# Patient Record
Sex: Male | Born: 1935 | Race: White | Hispanic: No
Health system: Southern US, Community
[De-identification: ages and names within clinical notes are randomized; demographics above are authoritative.]

---

## 2006-08-20 ENCOUNTER — Ambulatory Visit: Payer: Self-pay | Admitting: Internal Medicine

## 2008-02-20 ENCOUNTER — Ambulatory Visit: Payer: Self-pay | Admitting: Internal Medicine

## 2008-10-09 ENCOUNTER — Ambulatory Visit: Payer: Self-pay | Admitting: Internal Medicine

## 2010-10-01 ENCOUNTER — Ambulatory Visit: Payer: Self-pay | Admitting: Ophthalmology

## 2010-10-14 ENCOUNTER — Ambulatory Visit: Payer: Self-pay | Admitting: Ophthalmology

## 2013-12-24 ENCOUNTER — Ambulatory Visit: Payer: Self-pay | Admitting: Internal Medicine

## 2014-01-01 LAB — CBC
HCT: 41.2 % (ref 40.0–52.0)
HGB: 13.8 g/dL (ref 13.0–18.0)
MCH: 34.8 pg — ABNORMAL HIGH (ref 26.0–34.0)
MCHC: 33.4 g/dL (ref 32.0–36.0)
MCV: 104 fL — AB (ref 80–100)
PLATELETS: 74 10*3/uL — AB (ref 150–440)
RBC: 3.95 10*6/uL — ABNORMAL LOW (ref 4.40–5.90)
RDW: 17.1 % — ABNORMAL HIGH (ref 11.5–14.5)
WBC: 5.8 10*3/uL (ref 3.8–10.6)

## 2014-01-01 LAB — COMPREHENSIVE METABOLIC PANEL
Albumin: 3.7 g/dL (ref 3.4–5.0)
Alkaline Phosphatase: 220 U/L — ABNORMAL HIGH
Anion Gap: 7 (ref 7–16)
BUN: 27 mg/dL — ABNORMAL HIGH (ref 7–18)
Bilirubin,Total: 2.8 mg/dL — ABNORMAL HIGH (ref 0.2–1.0)
CHLORIDE: 105 mmol/L (ref 98–107)
Calcium, Total: 8.8 mg/dL (ref 8.5–10.1)
Co2: 28 mmol/L (ref 21–32)
Creatinine: 1.3 mg/dL (ref 0.60–1.30)
GFR CALC AF AMER: 59 — AB
GFR CALC NON AF AMER: 51 — AB
Glucose: 95 mg/dL (ref 65–99)
Osmolality: 284 (ref 275–301)
Potassium: 3.7 mmol/L (ref 3.5–5.1)
SGOT(AST): 589 U/L — ABNORMAL HIGH (ref 15–37)
SGPT (ALT): 433 U/L — ABNORMAL HIGH (ref 12–78)
Sodium: 140 mmol/L (ref 136–145)
Total Protein: 6.5 g/dL (ref 6.4–8.2)

## 2014-01-01 LAB — PROTIME-INR
INR: 2.3
PROTHROMBIN TIME: 24.6 s — AB (ref 11.5–14.7)

## 2014-01-01 LAB — PRO B NATRIURETIC PEPTIDE: B-Type Natriuretic Peptide: 16079 pg/mL — ABNORMAL HIGH (ref 0–450)

## 2014-01-01 LAB — LIPASE, BLOOD: LIPASE: 76 U/L (ref 73–393)

## 2014-01-01 LAB — APTT: Activated PTT: 38.4 secs — ABNORMAL HIGH (ref 23.6–35.9)

## 2014-01-01 LAB — TROPONIN I: Troponin-I: 0.05 ng/mL

## 2014-01-01 LAB — MAGNESIUM: MAGNESIUM: 2.2 mg/dL

## 2014-01-02 ENCOUNTER — Inpatient Hospital Stay: Payer: Self-pay | Admitting: Internal Medicine

## 2014-01-02 LAB — URINALYSIS, COMPLETE
BILIRUBIN, UR: NEGATIVE
BLOOD: NEGATIVE
Bacteria: NONE SEEN
GLUCOSE, UR: NEGATIVE mg/dL (ref 0–75)
Hyaline Cast: 3
Ketone: NEGATIVE
Leukocyte Esterase: NEGATIVE
NITRITE: NEGATIVE
Ph: 5 (ref 4.5–8.0)
Protein: 30
Specific Gravity: 1.02 (ref 1.003–1.030)
Squamous Epithelial: <1
WBC UR: 5 /HPF (ref 0–5)

## 2014-01-02 LAB — CK TOTAL AND CKMB (NOT AT ARMC)
CK, TOTAL: 331 U/L — AB
CK, Total: 391 U/L — ABNORMAL HIGH
CK, Total: 394 U/L — ABNORMAL HIGH
CK-MB: 4 ng/mL — ABNORMAL HIGH (ref 0.5–3.6)
CK-MB: 3.7 ng/mL — ABNORMAL HIGH (ref 0.5–3.6)
CK-MB: 4.3 ng/mL — AB (ref 0.5–3.6)

## 2014-01-02 LAB — TROPONIN I
TROPONIN-I: 0.04 ng/mL
Troponin-I: 0.04 ng/mL

## 2014-01-03 LAB — COMPREHENSIVE METABOLIC PANEL
ANION GAP: 4 — AB (ref 7–16)
AST: 228 U/L — AB (ref 15–37)
Albumin: 3.1 g/dL — ABNORMAL LOW (ref 3.4–5.0)
Alkaline Phosphatase: 193 U/L — ABNORMAL HIGH
BUN: 20 mg/dL — ABNORMAL HIGH (ref 7–18)
Bilirubin,Total: 1.5 mg/dL — ABNORMAL HIGH (ref 0.2–1.0)
CO2: 29 mmol/L (ref 21–32)
Calcium, Total: 8 mg/dL — ABNORMAL LOW (ref 8.5–10.1)
Chloride: 103 mmol/L (ref 98–107)
Creatinine: 1.17 mg/dL (ref 0.60–1.30)
EGFR (African American): >60
EGFR (Non-African Amer.): 58 — ABNORMAL LOW
Glucose: 83 mg/dL (ref 65–99)
Osmolality: 274 (ref 275–301)
Potassium: 3.2 mmol/L — ABNORMAL LOW (ref 3.5–5.1)
SGPT (ALT): 260 U/L — ABNORMAL HIGH (ref 12–78)
Sodium: 136 mmol/L (ref 136–145)
TOTAL PROTEIN: 5.7 g/dL — AB (ref 6.4–8.2)

## 2014-01-03 LAB — CBC WITH DIFFERENTIAL/PLATELET
BASOS PCT: 0.9 %
Basophil #: 0 10*3/uL (ref 0.0–0.1)
Eosinophil #: 0.2 10*3/uL (ref 0.0–0.7)
Eosinophil %: 3.9 %
HCT: 36.1 % — ABNORMAL LOW (ref 40.0–52.0)
HGB: 12.4 g/dL — ABNORMAL LOW (ref 13.0–18.0)
LYMPHS ABS: 0.6 10*3/uL — AB (ref 1.0–3.6)
LYMPHS PCT: 12.5 %
MCH: 35.7 pg — ABNORMAL HIGH (ref 26.0–34.0)
MCHC: 34.3 g/dL (ref 32.0–36.0)
MCV: 104 fL — AB (ref 80–100)
MONOS PCT: 6.6 %
Monocyte #: 0.3 x10 3/mm (ref 0.2–1.0)
NEUTROS ABS: 3.6 10*3/uL (ref 1.4–6.5)
Neutrophil %: 76.1 %
Platelet: 70 10*3/uL — ABNORMAL LOW (ref 150–440)
RBC: 3.47 10*6/uL — ABNORMAL LOW (ref 4.40–5.90)
RDW: 16.9 % — AB (ref 11.5–14.5)
WBC: 4.7 10*3/uL (ref 3.8–10.6)

## 2014-01-03 LAB — LIPID PANEL
Cholesterol: 196 mg/dL (ref 0–200)
HDL: 35 mg/dL — AB (ref 40–60)
Ldl Cholesterol, Calc: 143 mg/dL — ABNORMAL HIGH (ref 0–100)
Triglycerides: 90 mg/dL (ref 0–200)
VLDL CHOLESTEROL, CALC: 18 mg/dL (ref 5–40)

## 2014-01-03 LAB — PROTIME-INR
INR: 1.7
Prothrombin Time: 19.4 secs — ABNORMAL HIGH (ref 11.5–14.7)

## 2014-01-03 LAB — MAGNESIUM: Magnesium: 2 mg/dL

## 2014-01-05 LAB — PROTIME-INR
INR: 1.5
PROTHROMBIN TIME: 17.5 s — AB (ref 11.5–14.7)

## 2014-01-06 LAB — PROTIME-INR
INR: 1.4
PROTHROMBIN TIME: 17.2 s — AB (ref 11.5–14.7)

## 2014-01-07 LAB — BASIC METABOLIC PANEL
Anion Gap: 1 — ABNORMAL LOW (ref 7–16)
BUN: 12 mg/dL (ref 7–18)
CALCIUM: 8.2 mg/dL — AB (ref 8.5–10.1)
CO2: 35 mmol/L — AB (ref 21–32)
CREATININE: 0.85 mg/dL (ref 0.60–1.30)
Chloride: 98 mmol/L (ref 98–107)
EGFR (African American): >60
EGFR (Non-African Amer.): >60
Glucose: 100 mg/dL — ABNORMAL HIGH (ref 65–99)
Osmolality: 268 (ref 275–301)
Potassium: 4.5 mmol/L (ref 3.5–5.1)
Sodium: 134 mmol/L — ABNORMAL LOW (ref 136–145)

## 2014-01-07 LAB — CULTURE, BLOOD (SINGLE)

## 2014-01-07 LAB — PROTIME-INR
INR: 1.5
Prothrombin Time: 17.6 secs — ABNORMAL HIGH (ref 11.5–14.7)

## 2014-01-08 LAB — BASIC METABOLIC PANEL
Anion Gap: 1 — ABNORMAL LOW (ref 7–16)
BUN: 12 mg/dL (ref 7–18)
CO2: 33 mmol/L — AB (ref 21–32)
CREATININE: 0.82 mg/dL (ref 0.60–1.30)
Calcium, Total: 8.2 mg/dL — ABNORMAL LOW (ref 8.5–10.1)
Chloride: 96 mmol/L — ABNORMAL LOW (ref 98–107)
Glucose: 83 mg/dL (ref 65–99)
Osmolality: 260 (ref 275–301)
POTASSIUM: 4.6 mmol/L (ref 3.5–5.1)
Sodium: 130 mmol/L — ABNORMAL LOW (ref 136–145)

## 2014-01-08 LAB — PROTIME-INR
INR: 1.6
Prothrombin Time: 18.4 secs — ABNORMAL HIGH (ref 11.5–14.7)

## 2014-01-09 LAB — CBC WITH DIFFERENTIAL/PLATELET
BASOS PCT: 0.3 %
Basophil #: 0 10*3/uL (ref 0.0–0.1)
Eosinophil #: 0.1 10*3/uL (ref 0.0–0.7)
Eosinophil %: 2.7 %
HCT: 37.3 % — AB (ref 40.0–52.0)
HGB: 12.4 g/dL — ABNORMAL LOW (ref 13.0–18.0)
LYMPHS PCT: 5.2 %
Lymphocyte #: 0.2 10*3/uL — ABNORMAL LOW (ref 1.0–3.6)
MCH: 34.4 pg — AB (ref 26.0–34.0)
MCHC: 33.1 g/dL (ref 32.0–36.0)
MCV: 104 fL — AB (ref 80–100)
Monocyte #: 0.4 x10 3/mm (ref 0.2–1.0)
Monocyte %: 11.1 %
NEUTROS ABS: 3.2 10*3/uL (ref 1.4–6.5)
Neutrophil %: 80.7 %
Platelet: 72 10*3/uL — ABNORMAL LOW (ref 150–440)
RBC: 3.6 10*6/uL — AB (ref 4.40–5.90)
RDW: 17.3 % — ABNORMAL HIGH (ref 11.5–14.5)
WBC: 3.9 10*3/uL (ref 3.8–10.6)

## 2014-01-09 LAB — BASIC METABOLIC PANEL
BUN: 11 mg/dL (ref 7–18)
CO2: 36 mmol/L — AB (ref 21–32)
CREATININE: 0.88 mg/dL (ref 0.60–1.30)
Calcium, Total: 7.6 mg/dL — ABNORMAL LOW (ref 8.5–10.1)
Chloride: 91 mmol/L — ABNORMAL LOW (ref 98–107)
EGFR (African American): >60
Glucose: 138 mg/dL — ABNORMAL HIGH (ref 65–99)
OSMOLALITY: 255 (ref 275–301)
Potassium: 4.2 mmol/L (ref 3.5–5.1)
Sodium: 126 mmol/L — ABNORMAL LOW (ref 136–145)

## 2014-01-09 LAB — PROTIME-INR
INR: 2
Prothrombin Time: 22 secs — ABNORMAL HIGH (ref 11.5–14.7)

## 2014-01-10 LAB — PROTIME-INR
INR: 2.3
Prothrombin Time: 24.9 secs — ABNORMAL HIGH (ref 11.5–14.7)

## 2014-01-10 LAB — MAGNESIUM: MAGNESIUM: 2 mg/dL

## 2014-01-11 LAB — PROTIME-INR
INR: 2.5
Prothrombin Time: 26.5 secs — ABNORMAL HIGH (ref 11.5–14.7)

## 2014-01-11 LAB — BASIC METABOLIC PANEL
Anion Gap: 1 — ABNORMAL LOW (ref 7–16)
BUN: 12 mg/dL (ref 7–18)
Calcium, Total: 8.3 mg/dL — ABNORMAL LOW (ref 8.5–10.1)
Chloride: 97 mmol/L — ABNORMAL LOW (ref 98–107)
Co2: 34 mmol/L — ABNORMAL HIGH (ref 21–32)
Creatinine: 1.01 mg/dL (ref 0.60–1.30)
EGFR (Non-African Amer.): >60
Glucose: 78 mg/dL (ref 65–99)
OSMOLALITY: 263 (ref 275–301)
POTASSIUM: 4.5 mmol/L (ref 3.5–5.1)
Sodium: 132 mmol/L — ABNORMAL LOW (ref 136–145)

## 2014-01-11 LAB — DIGOXIN LEVEL: Digoxin: 1.13 ng/mL

## 2014-01-14 LAB — IRON AND TIBC
IRON BIND. CAP.(TOTAL): 187 ug/dL — AB (ref 250–450)
IRON SATURATION: 33 %
IRON: 61 ug/dL — AB (ref 65–175)
UNBOUND IRON-BIND. CAP.: 126 ug/dL

## 2014-01-14 LAB — CBC WITH DIFFERENTIAL/PLATELET
BASOS ABS: 0 10*3/uL (ref 0.0–0.1)
BASOS PCT: 0.7 %
EOS ABS: 0.2 10*3/uL (ref 0.0–0.7)
EOS PCT: 2.8 %
HCT: 38.6 % — ABNORMAL LOW (ref 40.0–52.0)
HGB: 13 g/dL (ref 13.0–18.0)
Lymphocyte #: 0.5 10*3/uL — ABNORMAL LOW (ref 1.0–3.6)
Lymphocyte %: 7.9 %
MCH: 34.6 pg — AB (ref 26.0–34.0)
MCHC: 33.7 g/dL (ref 32.0–36.0)
MCV: 103 fL — ABNORMAL HIGH (ref 80–100)
Monocyte #: 0.4 x10 3/mm (ref 0.2–1.0)
Monocyte %: 6.6 %
NEUTROS ABS: 5.1 10*3/uL (ref 1.4–6.5)
NEUTROS PCT: 82 %
Platelet: 133 10*3/uL — ABNORMAL LOW (ref 150–440)
RBC: 3.77 10*6/uL — ABNORMAL LOW (ref 4.40–5.90)
RDW: 16.2 % — AB (ref 11.5–14.5)
WBC: 6.2 10*3/uL (ref 3.8–10.6)

## 2014-01-14 LAB — SODIUM: Sodium: 133 mmol/L — ABNORMAL LOW (ref 136–145)

## 2014-01-15 LAB — PROTIME-INR
INR: 1.8
PROTHROMBIN TIME: 20.1 s — AB (ref 11.5–14.7)

## 2014-01-16 LAB — BASIC METABOLIC PANEL
ANION GAP: 5 — AB (ref 7–16)
BUN: 15 mg/dL (ref 7–18)
CALCIUM: 8.4 mg/dL — AB (ref 8.5–10.1)
CHLORIDE: 96 mmol/L — AB (ref 98–107)
Co2: 35 mmol/L — ABNORMAL HIGH (ref 21–32)
Creatinine: 1.09 mg/dL (ref 0.60–1.30)
EGFR (African American): >60
EGFR (Non-African Amer.): >60
Glucose: 69 mg/dL (ref 65–99)
Osmolality: 271 (ref 275–301)
Potassium: 3.5 mmol/L (ref 3.5–5.1)
SODIUM: 136 mmol/L (ref 136–145)

## 2014-01-16 LAB — CBC WITH DIFFERENTIAL/PLATELET
BASOS ABS: 0 10*3/uL (ref 0.0–0.1)
BASOS PCT: 0.6 %
Eosinophil #: 0.1 10*3/uL (ref 0.0–0.7)
Eosinophil %: 1.3 %
HCT: 36.6 % — ABNORMAL LOW (ref 40.0–52.0)
HGB: 12.6 g/dL — AB (ref 13.0–18.0)
Lymphocyte #: 0.3 10*3/uL — ABNORMAL LOW (ref 1.0–3.6)
Lymphocyte %: 4.9 %
MCH: 35.2 pg — ABNORMAL HIGH (ref 26.0–34.0)
MCHC: 34.6 g/dL (ref 32.0–36.0)
MCV: 102 fL — ABNORMAL HIGH (ref 80–100)
MONOS PCT: 4.6 %
Monocyte #: 0.3 x10 3/mm (ref 0.2–1.0)
Neutrophil #: 5.7 10*3/uL (ref 1.4–6.5)
Neutrophil %: 88.6 %
Platelet: 141 10*3/uL — ABNORMAL LOW (ref 150–440)
RBC: 3.59 10*6/uL — ABNORMAL LOW (ref 4.40–5.90)
RDW: 15.9 % — ABNORMAL HIGH (ref 11.5–14.5)
WBC: 6.4 10*3/uL (ref 3.8–10.6)

## 2014-01-16 LAB — PROTIME-INR
INR: 1.6
Prothrombin Time: 18.3 secs — ABNORMAL HIGH (ref 11.5–14.7)

## 2014-01-17 LAB — COMPREHENSIVE METABOLIC PANEL
AST: 27 U/L (ref 15–37)
Albumin: 2.6 g/dL — ABNORMAL LOW (ref 3.4–5.0)
Alkaline Phosphatase: 96 U/L
Anion Gap: 3 — ABNORMAL LOW (ref 7–16)
BUN: 15 mg/dL (ref 7–18)
Bilirubin,Total: 0.9 mg/dL (ref 0.2–1.0)
CHLORIDE: 90 mmol/L — AB (ref 98–107)
CREATININE: 1.19 mg/dL (ref 0.60–1.30)
Calcium, Total: 7.9 mg/dL — ABNORMAL LOW (ref 8.5–10.1)
Co2: 38 mmol/L — ABNORMAL HIGH (ref 21–32)
EGFR (Non-African Amer.): 57 — ABNORMAL LOW
Glucose: 122 mg/dL — ABNORMAL HIGH (ref 65–99)
OSMOLALITY: 265 (ref 275–301)
Potassium: 3 mmol/L — ABNORMAL LOW (ref 3.5–5.1)
SGPT (ALT): 38 U/L (ref 12–78)
Sodium: 131 mmol/L — ABNORMAL LOW (ref 136–145)
Total Protein: 6.6 g/dL (ref 6.4–8.2)

## 2014-01-17 LAB — TROPONIN I
TROPONIN-I: 0.02 ng/mL
Troponin-I: 0.04 ng/mL

## 2014-01-17 LAB — MAGNESIUM: Magnesium: 1.7 mg/dL — ABNORMAL LOW

## 2014-01-18 LAB — BASIC METABOLIC PANEL
Anion Gap: 7 (ref 7–16)
BUN: 15 mg/dL (ref 7–18)
CHLORIDE: 93 mmol/L — AB (ref 98–107)
CREATININE: 1.16 mg/dL (ref 0.60–1.30)
Calcium, Total: 8.2 mg/dL — ABNORMAL LOW (ref 8.5–10.1)
Co2: 36 mmol/L — ABNORMAL HIGH (ref 21–32)
GFR CALC NON AF AMER: 58 — AB
Glucose: 68 mg/dL (ref 65–99)
Osmolality: 271 (ref 275–301)
Potassium: 3.4 mmol/L — ABNORMAL LOW (ref 3.5–5.1)
SODIUM: 136 mmol/L (ref 136–145)

## 2014-01-18 LAB — CBC WITH DIFFERENTIAL/PLATELET
BASOS ABS: 0 10*3/uL (ref 0.0–0.1)
BASOS PCT: 0.4 %
EOS PCT: 0.6 %
Eosinophil #: 0.1 10*3/uL (ref 0.0–0.7)
HCT: 36.9 % — ABNORMAL LOW (ref 40.0–52.0)
HGB: 12.4 g/dL — ABNORMAL LOW (ref 13.0–18.0)
LYMPHS PCT: 1 %
Lymphocyte #: 0.1 10*3/uL — ABNORMAL LOW (ref 1.0–3.6)
MCH: 34.7 pg — ABNORMAL HIGH (ref 26.0–34.0)
MCHC: 33.4 g/dL (ref 32.0–36.0)
MCV: 104 fL — ABNORMAL HIGH (ref 80–100)
Monocyte #: 0.2 x10 3/mm (ref 0.2–1.0)
Monocyte %: 1.5 %
NEUTROS PCT: 96.5 %
Neutrophil #: 10.1 10*3/uL — ABNORMAL HIGH (ref 1.4–6.5)
Platelet: 123 10*3/uL — ABNORMAL LOW (ref 150–440)
RBC: 3.56 10*6/uL — AB (ref 4.40–5.90)
RDW: 15.9 % — ABNORMAL HIGH (ref 11.5–14.5)
WBC: 10.5 10*3/uL (ref 3.8–10.6)

## 2014-01-18 LAB — TROPONIN I: Troponin-I: 0.04 ng/mL

## 2014-01-18 LAB — DIGOXIN LEVEL: Digoxin: 0.98 ng/mL

## 2014-01-20 LAB — EXPECTORATED SPUTUM ASSESSMENT W GRAM STAIN, RFLX TO RESP C

## 2014-01-24 ENCOUNTER — Ambulatory Visit: Payer: Self-pay | Admitting: Internal Medicine

## 2014-01-24 DEATH — deceased

## 2014-10-06 IMAGING — CR DG CHEST 1V PORT
1 series · 1 of 1 positions shown · non-contrast
Comparison: None.

CLINICAL DATA: Chest and back pain. The patient fell earlier.
Increased heart rate. Oxygen requirement

EXAM:
PORTABLE CHEST - 1 VIEW

[ap]
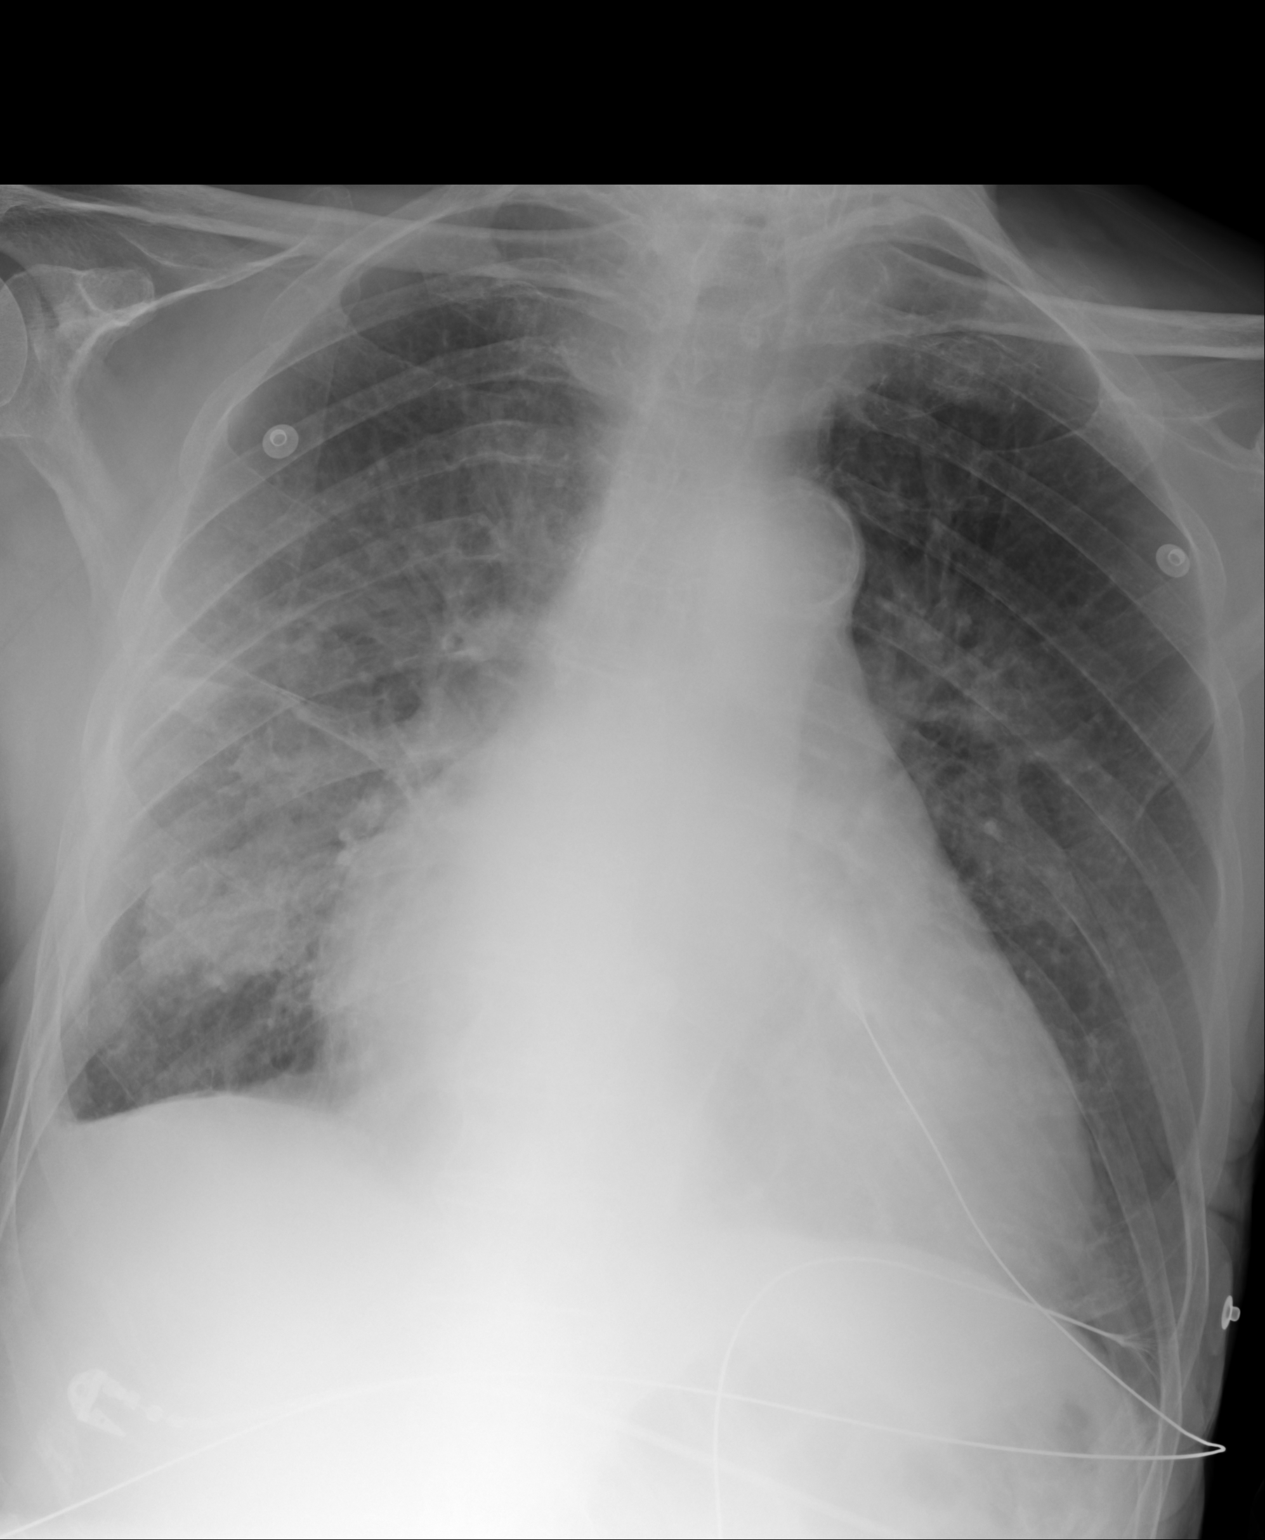

[1 of 1 positions shown; findings below may reference images not displayed]

FINDINGS: The heart is enlarged. There is mild pulmonary edema. More focal
opacity is identified within the right lower lobe. The findings
raise a question of mass or infiltrate. Small right pleural effusion
is noted.
IMPRESSION: 1. Cardiomegaly and mild edema.
2. Right lower lobe mass versus infiltrate. Followup is recommended.
If the patient has symptoms of pneumonia, consider radiographic
follow-up following clinical improvement. If the patient does not
have symptoms of pneumonia, chest CT with contrast is recommended.

## 2014-10-10 IMAGING — CR DG CHEST 1V PORT
1 series · 1 of 1 positions shown · non-contrast
Comparison: DG CHEST 1V PORT dated 01/03/2014;

CLINICAL DATA: CHF

EXAM:
PORTABLE CHEST - 1 VIEW

[ap]
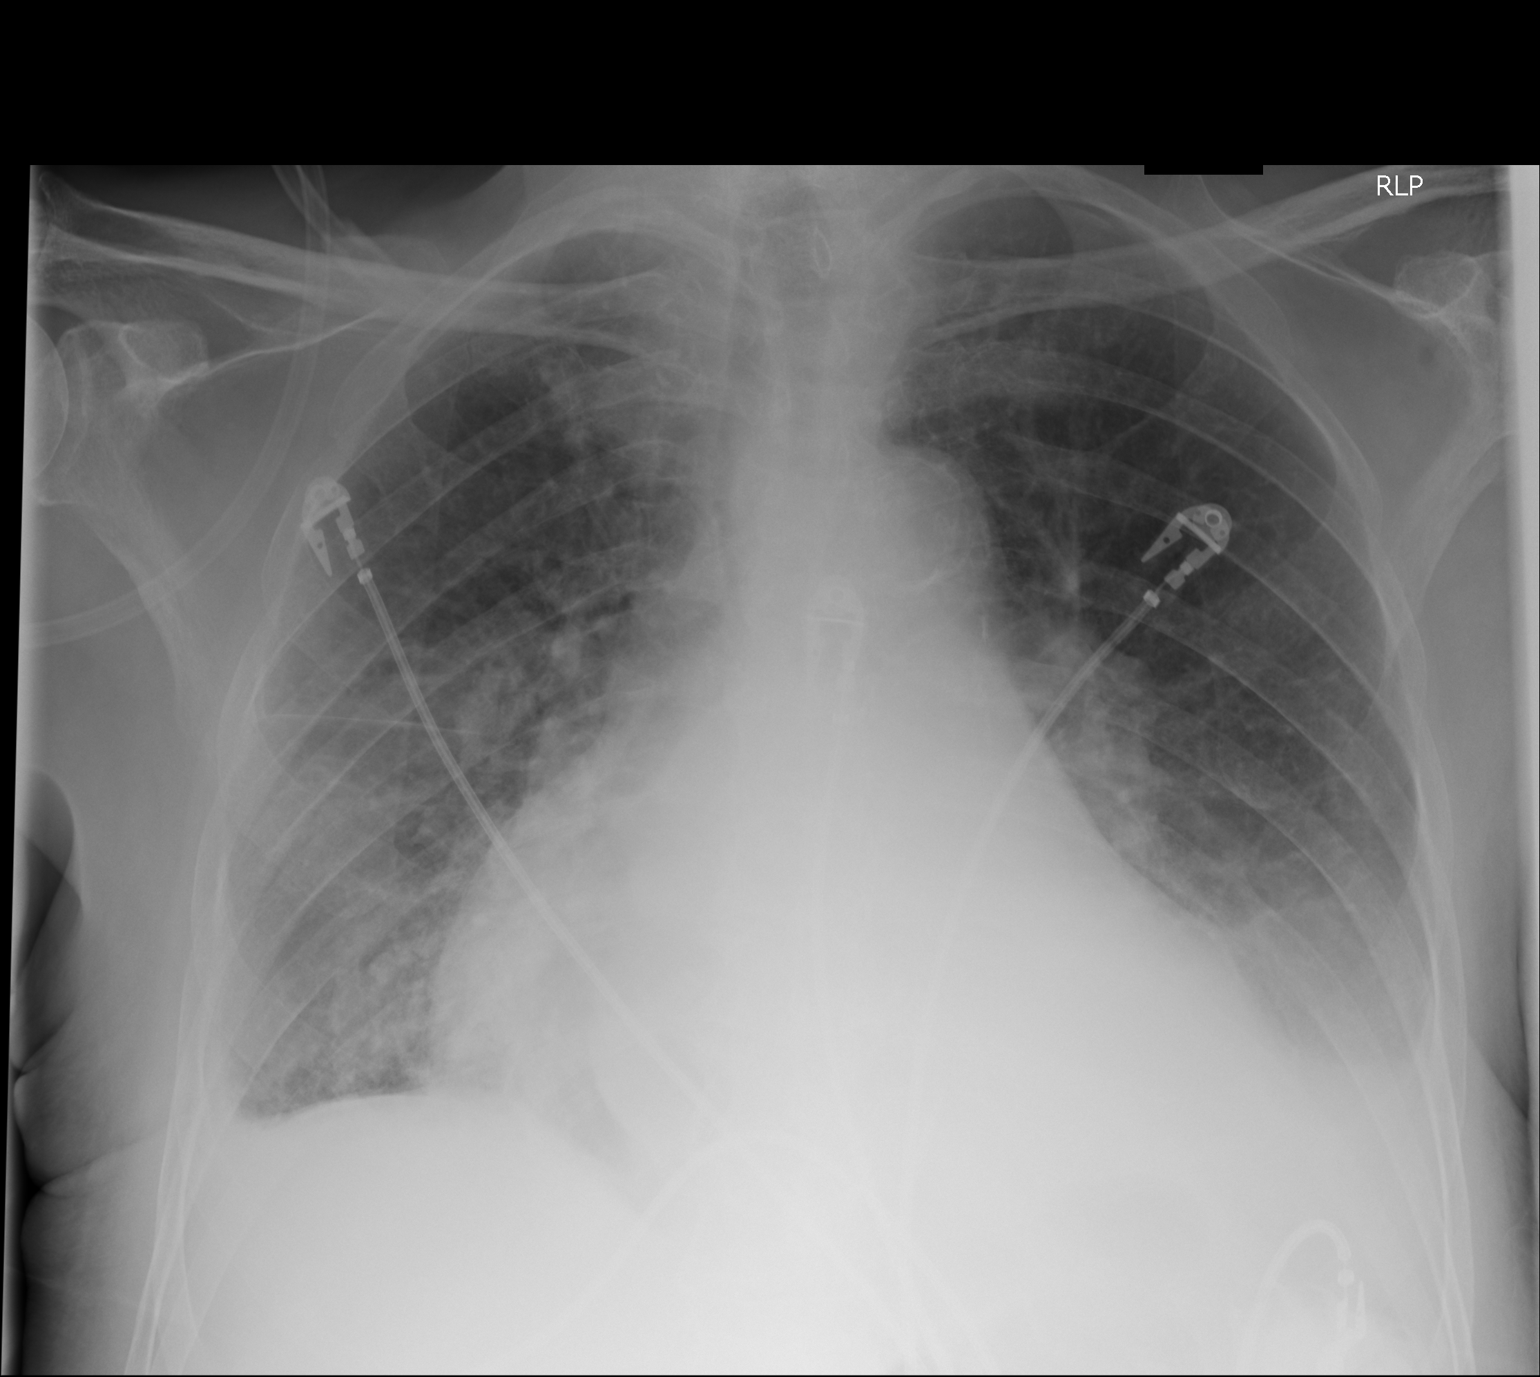

[1 of 1 positions shown; findings below may reference images not displayed]

DG CHEST 1V PORT
dated 01/01/2014; CT CHEST W/ CM dated 10/09/2008; CT CHEST W/ CM
dated 08/20/2006
FINDINGS: Grossly unchanged enlarged cardiac silhouette and mediastinal
contours with atherosclerotic plaque within the thoracic aorta.
Grossly unchanged small bilateral effusions and associated bibasilar
opacities, left greater than right. Pulmonary vasculature remains
indistinct with cephalization of flow. No pneumothorax. Grossly
unchanged bones.
IMPRESSION: Grossly unchanged findings of cardiomegaly, pulmonary edema, small
bilateral effusions and associated bibasilar opacities, left greater
than right, atelectasis versus infiltrate.

## 2014-10-20 IMAGING — CR DG CHEST 2V
1 series · 2 of 2 positions shown · non-contrast
Comparison: DG CHEST 1V PORT dated 01/08/2014;

CLINICAL DATA: Clinical pneumonia

EXAM:
CHEST  2 VIEW

[Series 1: x chest ap · 0.14mm/px · 2 of 2 slices shown]
[im 1/2]
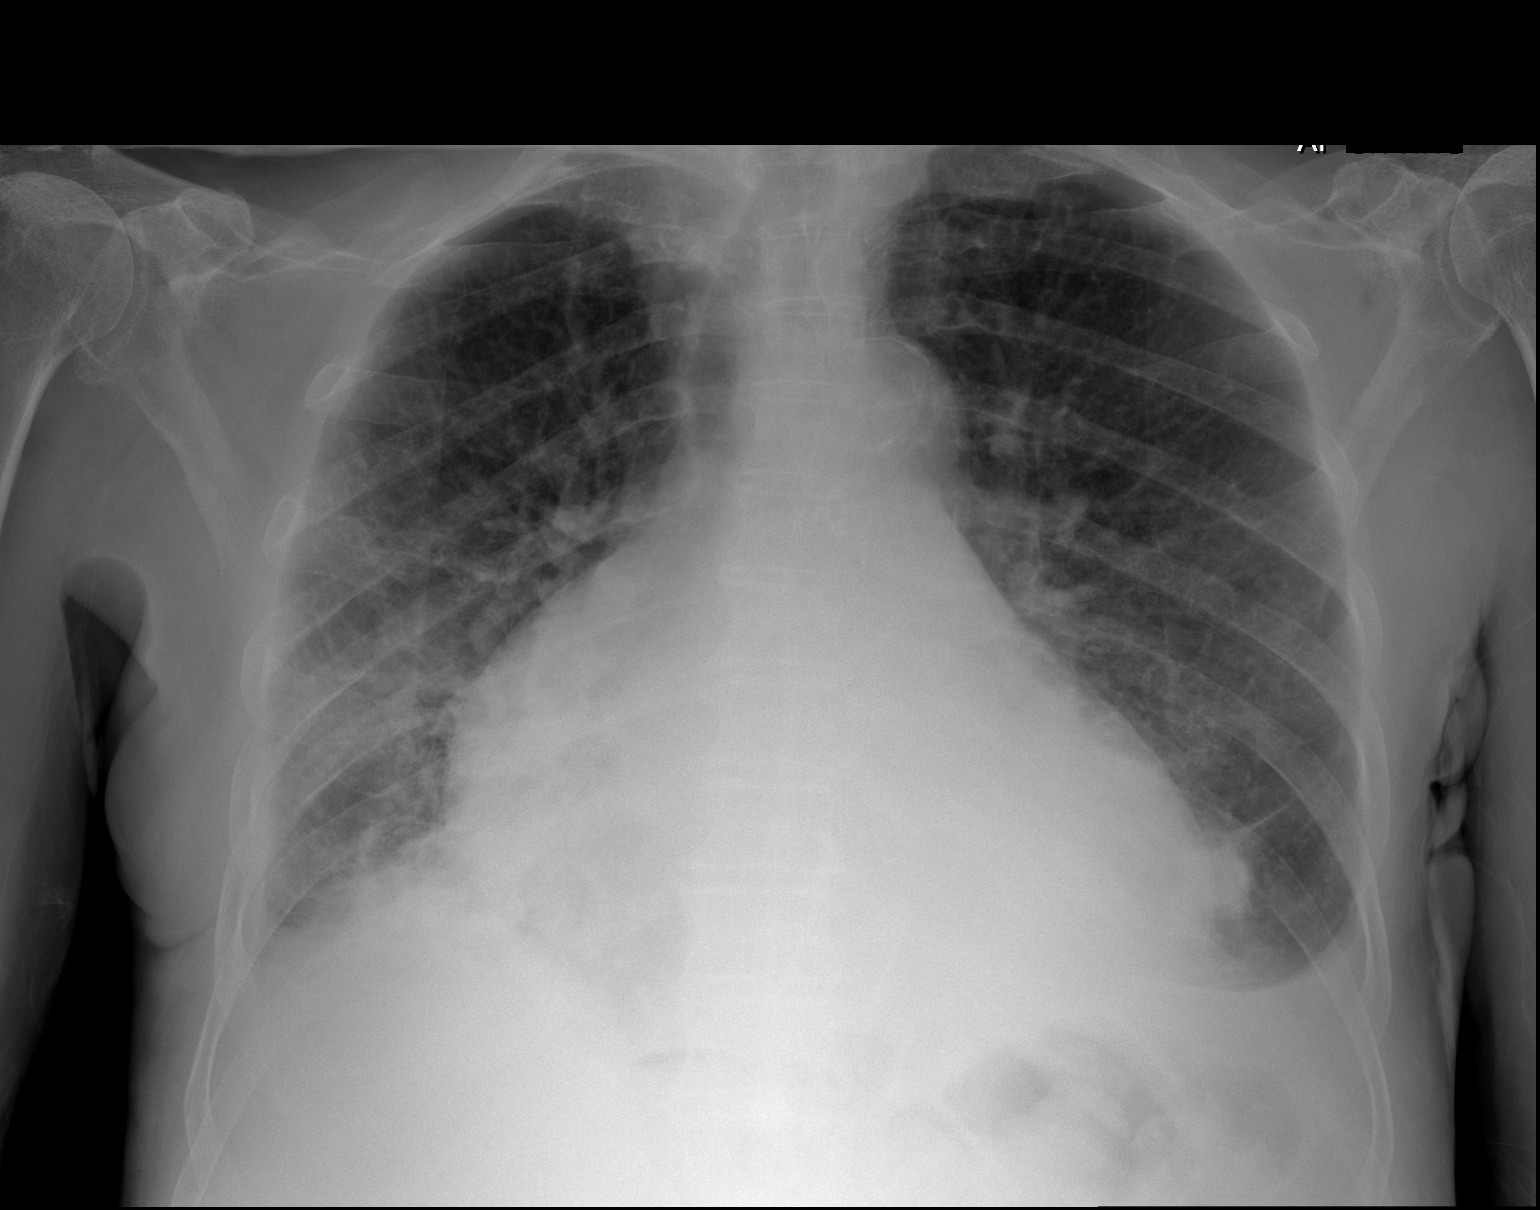
[im 2/2]
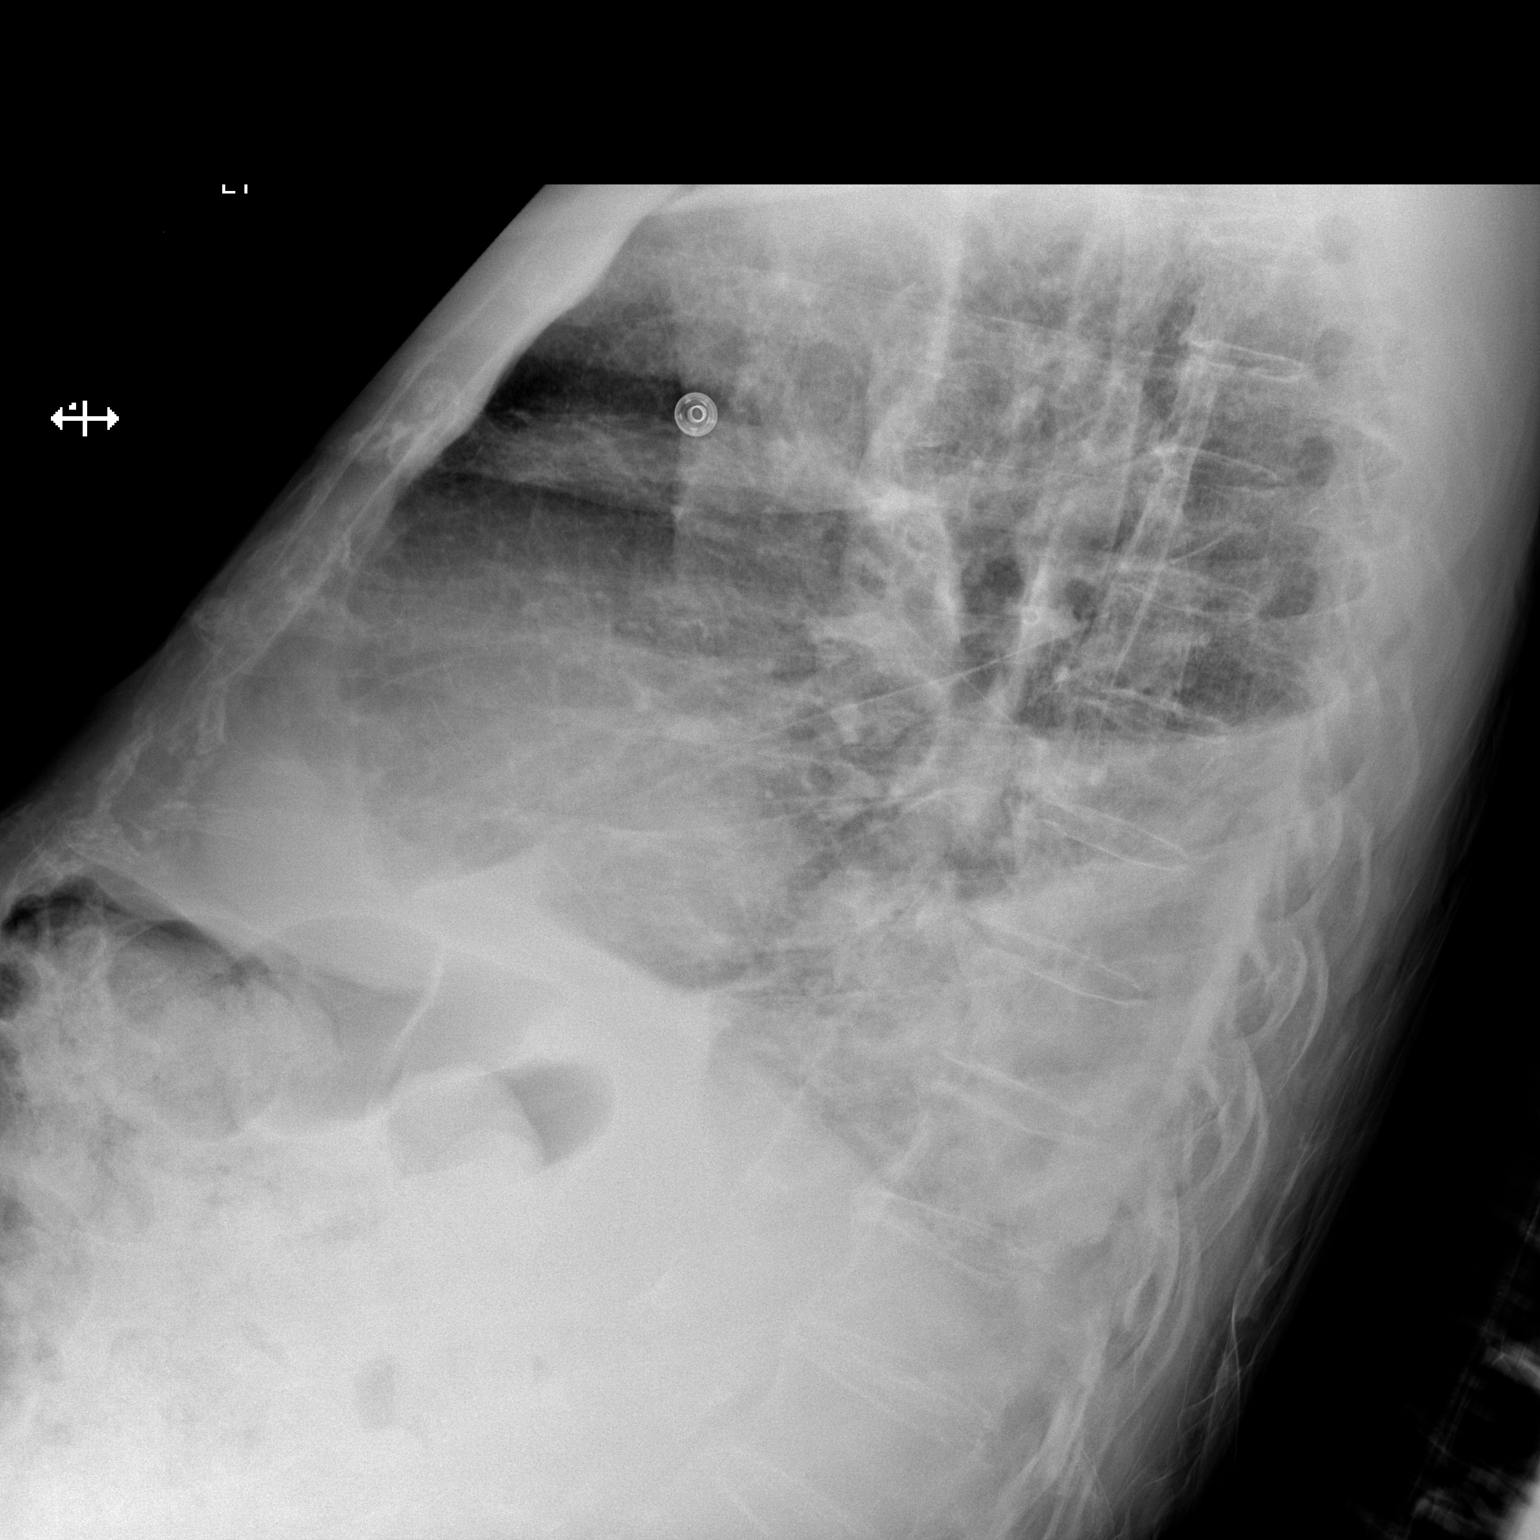

[2 of 2 positions shown; findings below may reference images not displayed]

DG CHEST 1V PORT
dated 01/05/2014; DG CHEST 1V PORT dated 01/03/2014; DG CHEST 1V PORT
dated 01/01/2014
FINDINGS: The lungs are adequately inflated. There is persistent blunting of
the costophrenic angles laterally and posteriorly consistent with
moderate-sized pleural effusions. There is an area of nodular
density that projects just lateral to the left cardiac apex which
was not previously visible due to surrounding parenchymal density.
The pulmonary interstitial markings remain increased. The
cardiopericardial silhouette remains enlarged. The central pulmonary
vascularity is prominent. The trachea is midline.
IMPRESSION: 1. There are persistent moderate-sized bilateral pleural effusions
layering inferiorly.
2. Enlargement of the cardiac silhouette, mild engorgement of the
central pulmonary vascularity, and increased interstitial markings
bilaterally may reflect CHF. One certainly cannot exclude bibasilar
pneumonia in the appropriate clinical setting.
3. An area of focal nodularity just lateral to the left cardiac apex
is demonstrated today and will merit continued surveillance on
follow-up films.

## 2014-10-23 IMAGING — CR DG CHEST 1V PORT
1 series · 1 of 1 positions shown · non-contrast
Comparison: DG CHEST 2V dated 01/15/2014

CLINICAL DATA: Short of breath

EXAM:
PORTABLE CHEST - 1 VIEW

[ap]
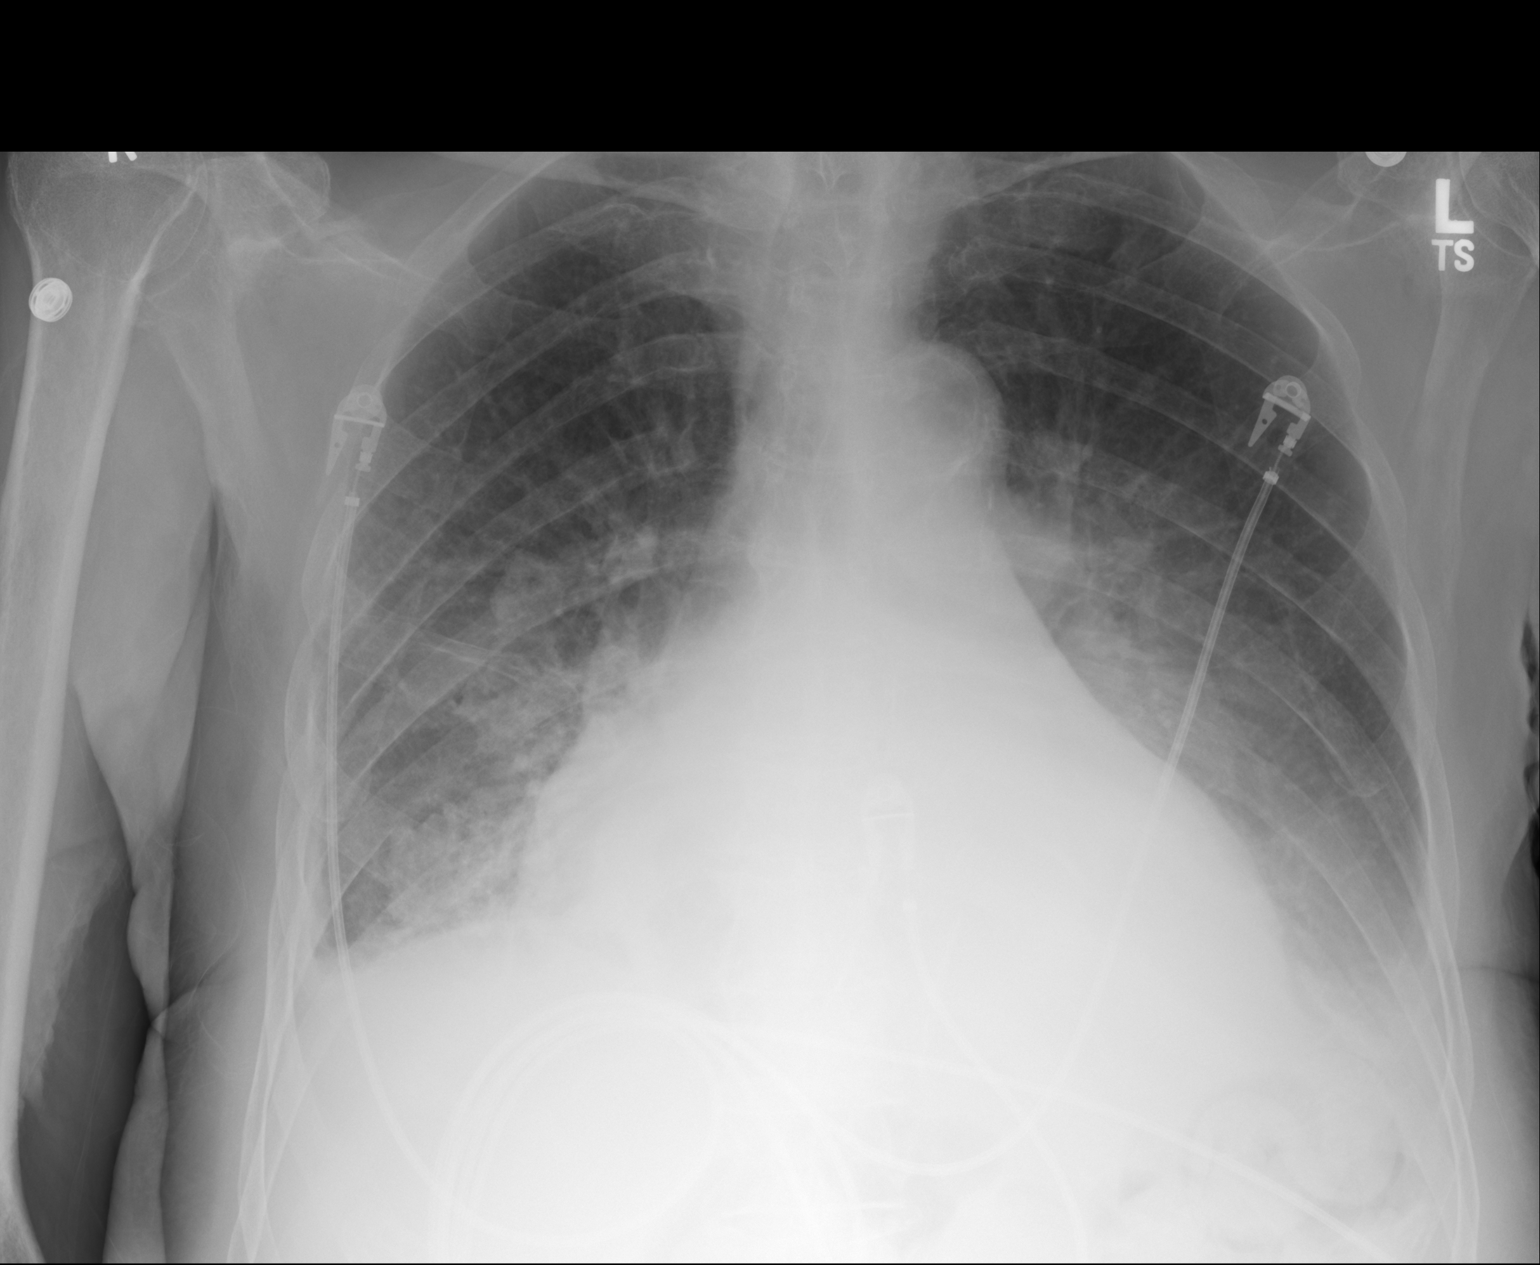

[1 of 1 positions shown; findings below may reference images not displayed]

FINDINGS: Moderate cardiomegaly. Central basilar airspace disease increased.
Small pleural effusions not significantly changed. Vascular
congestion. No pneumothorax.
IMPRESSION: Increased bilateral central and basilar airspace disease. Stable
pleural effusions.

## 2015-02-16 NOTE — H&P (Signed)
PATIENT NAME:  Dan Lawson, Dan Lawson MR#:  [id number] DATE OF BIRTH:  1928/11/30  DATE OF ADMISSION:  01/02/2014  PRIMARY CARE PHYSICIAN: Dr. Aletha Halim.   REFERRING EMERGENCY ROOM PHYSICIAN: Dr. Shaune Pollack.   CHIEF COMPLAINT: Shortness of breath.   HISTORY OF PRESENT ILLNESS: The patient is an 81 year old male is sent over from the home to Emergency Department via EMS as the patient is complaining of shortness of breath. By the time EMS arrived the patient was in his bed with palpitations and heart rate of 180. The patient was given 10 mg of Cardizem IV and then is brought into the ER. The patient was complaining of chest pain and back pain at that time. In the ER, the patient was found to be in atrial fibrillation with RVR regarding which the patient is placed on Cardizem drip. Blood work has revealed an INR of 2.3 with PT 24.6. The patient's LFTs are elevated including alkaline phosphatase, AST and ALT. Ultrasound of the abdomen has revealed cholelithiasis and also mild gallbladder thickening. Further evaluation and HIDA scan is recommended to evaluate for possible acute appendicitis as this is noticed. ER physician has discussed with Dr. Michela Pitcher consultant, who has recommended medical management first in view of atrial fibrillation with RVR and possible pneumonia. The patient is a very, very poor historian and just was reporting left-sided burning chest pain. Unable to get any history from the patient other than reporting for chest pain. He says he does not know why he was brought into the ER. No family members are available at bedside and could not reach any one of them. Chest x-ray reveals right lower lobe mass  versus infiltrate and the patient was given one dose of IV Rocephin after cultures.   PAST MEDICAL HISTORY: Unavailable  PAST SURGICAL HISTORY: Also unavailable as we do not have any old medical records.   ALLERGIES: The patient has no known drug allergies.   PSYCHOSOCIAL HISTORY: Lives at home.  History of smoking, alcohol or illicit drug use unobtainable as the patient is with altered mental status.   FAMILY HISTORY: Not available.   REVIEW OF SYSTEMS: Unobtainable as the patient is with altered mental status.    HOME MEDICATIONS: Not available, but INR to 2.3. I assume the patient is on Coumadin.   PHYSICAL EXAMINATION:   VITAL SIGNS: Temperature 97.4, pulse 80,  respirations 20, blood pressure 104/67, pulse oximetry is 97%.  GENERAL APPEARANCE: Not in acute distress. Moderately built and obese.  HEENT: Normocephalic, atraumatic. Pupils are equal, reacting to light and accommodation. No scleral icterus, conjunctival injection. No sinus tenderness. No postnasal drip. Dry mucous membranes.  NECK: Supple. No JVD. No thyromegaly. Range of motion is intact.  LUNGS: Positive crackles, moderate air entry.  CARDIOVASCULAR: Irregularly irregular. Positive murmur.  GASTROINTESTINAL: Soft. Bowel sounds are positive in all four quadrants. Right upper quadrant tenderness is present. No rebound tenderness.  NEUROLOGIC: Awake and alert but disoriented. Poor historian, spontaneously moving extremities, follows few verbal commands. Sensory is intact.  EXTREMITIES: Right ankle has large bruise not with active bleeding. Left ankle is edematous.  SKIN: Warm, normal turgor. No rashes.  MUSCULOSKELETAL: No joint effusion, tenderness, erythema.  LABORATORY AND IMAGING STUDIES:  Chest x-ray, cardiomegaly and mild edema, right  lower lobe mass versus infiltrate. Follow-up is recommended if the patient has symptoms of pneumonia. If the patient does not have symptoms of pneumonia consider CT of the chest with contrast. Ultrasound of the abdomen has revealed cholelithiasis with mild gallbladder  wall thickening. Further evaluation with HIDA scan is recommended to evaluate for possible acute cholecystitis. Ascites is noted as well. CT of the chest, abdomen and pelvis without contrast has revealed marked  cardiomegaly, trace pleural effusions, right upper lobe ground glass opacity may reflect an infectious or inflammatory process. Recommended CT follow-up to document resolution. Calcified pleural plaque on the right may reflect hemothorax or empyema versus prior asbestos exposure, small to moderate amount of ascites, cholelithiasis on  ultrasound is concern for acute biliary pathology, diffuse esophageal wall thickening reflecting esophagitis or GERD, advanced atherosclerotic disease of the abdominal aorta and mid T12 compression deformity, it is age indeterminate, but not favored to be acute, nonobstructing renal stones. No hydronephrosis.   Glucose 95, BNP is 16,079, BUN 27 . Sodium, potassium, chloride and CO2 are normal.  GFR 51. The rest of the Chem-8 is normal. LFTs are elevated. Bili total is at 2.8, AST 589, ALT 433, alkaline phosphatase 220. Total protein and albumin are normal. Troponin first set is 0.05, second one 0.4. WBC 5.8, hemoglobin 13.8, hematocrit 41.2, platelets 74. MCV is 104, PT 24.6, INR 2.3. Activated PTT 38.4.   ASSESSMENT AND PLAN: An 81 year old Caucasian male sent over to the ER from home via EMS for shortness of breath and complaining of chest pain with palpitations, will be admitted with following assessment and plan:  1. Atrial fibrillation with rapid ventricular response, probably atrial fibrillation is chronic in nature. We will admit him to Critical Care Unit. Continue Cardizem GTT. Currently INR is at 2.3, probably the patient is on Coumadin but this information is not available at this time.  2. Home medication list is not available. No family members are available at bedside.  3. Acute cholecystitis. Will keep him n.p.o. and provide IV fluids. Surgical consult is placed to Dr. Michela PitcherEly notified by the ER physician. INR is at 2.3 which needs to be reversed if surgery is a consideration.  4. Right lower lobe pneumonia. We will provide him IV antibiotics. Radiologist is  recommending repeat follow-up studies to ensure a resolution of the process.  5. Altered mental status, probably from pneumonia and encephalopathy, questionable baseline dementia. We will continue close monitoring of the patient.  6. Thrombocytopenia from bruising is noted, no active bleeding. We will monitor platelet count closely and will provide him gastrointestinal prophylaxis and deep vein thrombosis prophylaxis is not needed at this time as INR is therapeutic.     We will try to reach family in the a.m. We will transfer the patient to Dr. Judithann SheenSparks in a.m.   TOTAL CRITICAL CARE TIME SPENT: 50 minutes.    ____________________________ Ramonita LabAruna Mitzie Marlar, MD ag:sg D: 01/02/2014 02:19:04 ET T: 01/02/2014 06:20:05 ET JOB#: [id number]  cc: Ramonita LabAruna Shaunae Sieloff, MD, <Dictator> Ramonita LabARUNA Ramata Strothman MD ELECTRONICALLY SIGNED 01/30/2014 7:27

## 2015-02-16 NOTE — Consult Note (Signed)
Reason for consultation: Foley catheter placement  Impression: 1.  Phimosis- he had a very tight phimosis and was unable to visualize the urethral meatus.  Several attempts at blind catheter placement were attempted.  2.  Urinary retention-most likely chronic.  He states he has not uncomfortable.  If catheter drainage is desired the best option would be percutaneous suprapubic tube placement by interventional radiology.  Since he is not in acute pain will discuss with primary team in morning.  81 year old male admitted with pneumonia and atrial fibrillation.  He was recently made comfort care.  He has a long history of decreased force and caliber of his urinary stream and sensation of incomplete emptying with intermittency.  A bladder scan was performed today which showed greater than 1 L of urine in the bladder.  A Foley catheter was ordered however unable to be placed by the nursing staff.  He denies previous history of urologic problems.  Exam: Tight phimosis present with inability to expose the glans.  The fibrotic opening would not accept a 14 French catheter.  Several attempts at blind placement of a 12 French catheter were attempted without success.  Electronic Signatures: Riki AltesStoioff, Lesli Issa C (MD)  (Signed on 23-Mar-15 18:58)  Authored  Last Updated: 23-Mar-15 18:58 by Riki AltesStoioff, Kasem Mozer C (MD)

## 2015-02-16 NOTE — Consult Note (Signed)
   Comments   Pt now awake though somewhat lethargic. Suspect altered mental status secondary to tussionex. However, it does not seem that pt is going to improve enough to go to [facility] and is more appropriate for the [facility]. I spoke with pt about this and he agrees. [facility]ospice Home liason notified and will see pt in AM.  Dx: cardiomyopathy, EF < 20%   Secondary Dx: respiratory failure, MRSA pneumonia, protein calorie malnutrition  Electronic Signatures: Franki Stemen, Harriett SineNancy (MD)  (Signed 26-Mar-15 20:35)  Authored: Palliative Care   Last Updated: 26-Mar-15 20:35 by Shahan Starks, Harriett SineNancy (MD)

## 2015-02-16 NOTE — Consult Note (Signed)
Brief Consult Note: Diagnosis: gallstones.   Patient was seen by consultant.   Recommend further assessment or treatment.   Discussed with Attending MD.   Comments: No sign of acute cholecystitis therefore no indication for cholecystostomy tube. pt coagulopathic and thrombocytopenic and tube placement would be problematic at this time. Will disc with PD.  Electronic Signatures: Lattie Hawooper, Richard E (MD)  (Signed 18-Mar-15 14:33)  Authored: Brief Consult Note   Last Updated: 18-Mar-15 14:33 by Lattie Hawooper, Richard E (MD)

## 2015-02-16 NOTE — Discharge Summary (Signed)
PATIENT NAME:  Dan Lawson, Dan Lawson MR#:  [id number] DATE OF BIRTH:  1929/04/26  THIS INTERIM SUMMARY COVERS FROM 01/02/2014 UNTIL 01/12/2014.   ADMITTING DIAGNOSIS: Shortness of breath.   DISCHARGE DIAGNOSES: 1. Acute respiratory failure due to acute systolic congestive heart failure, with severely depressed ejection fraction.   2. Hypotension, shock, possibly cardiogenic. Had to be treated with pressors and dobutamine. The patient still did not respond.   3.  Ground-glass pneumonia on CT scan, status post treatment.   4.  Atrial fibrillation with rapid ventricular response, with severe cardiomyopathy, treated with amiodarone, digoxin.   5.  Possible acute cholecystitis with abdominal pain, not a surgical candidate.   6.  Hyperlipidemia.   7.  Constipation.   PROGNOSIS: Very poor. The patient is DNR, made comfort care.   CONSULTANTS DURING HOSPITALIZATION: Dr. Ubaldo Glassing of Cardiology. Dr. Leanora Cover. Dr. Ermalinda Memos.  PERTINENT LABS AND EVALUATIONS: EKG: AFib with [place]VR[place]NP [place] [address]. WBC 5.8, hemoglobin 13.8, platelet count was 74. BMP: Glucose 95, BUN 237, creatinine 1.30, sodium 140, potassium 3.7, chloride 105, CO2 is 28. Bili total 2.8, alk phos was 220, ALT was 433, AST was 589. Ultrasound of the abdomen showed cholelithiasis with mild gallbladder thickening. CT of the chest:  Ground-glass opacities with possible pneumonia. Blood culture showed one Coag-negative Staph. Urinalysis was negative. Echocardiogram showed EF less than 20%, severely decreased global left ventricular systolic function, mild to moderate increased left ventricular cavitary size, mildly dilated left atrium, mildly dilated right atrium, mild to moderate mitral valve regurg.   HOSPITAL COURSE: Please refer to H and P done by the admitting physician. The patient is a pleasant, 81 year old white male who presented to the ED with acute respiratory failure. He was short of breath, was noted to also be in AFib with RVR. The patient was  started on Cardizem drip, and was kept on the Cardizem drip. Also, he was noted to have acute respiratory failure, subsequently felt to have acute systolic CHF, based on his echo.   IN ORDER OF HIS PROBLEMS: 1.  AFib with RVR. The patient was kept on Cardizem drip, subsequently discontinued, but then his heart rate increased. He had to be placed on an amiodarone drip. Currently he is off all medications due to comfort care.   2.  Acute respiratory failure due to acute systolic CHF as well as possible pneumonia. There was limited diuresis due to his hypotension. The patient's respiratory status is currently labile. Due to his severe cardiomyopathy, patient's prognosis is very poor.   3.  Abdominal pain, with possible acute cholecystitis, with elevated LFTs. He was seen by Surgery and given IV antibiotics. He was deemed a very poor candidate, based on all his medical issues.   4.  Refractory hypotension. The patient continued to be hypotensive despite treatment. He had to require pressors, including dobutamine, Levophed. Currently he is off all medications, again due to being comfort care. The patient was also thought to have pneumonia, which was treated. Prognosis guarded. The patient is currently comfort care. Has been followed by palliative care. The plan is for him to go to a skilled nursing facility with possible hospice.  MEDICATIONS AT THE TIME OF THE INTERIM SUMMARY: Zofran 4 mg IV q. 6 p.r.n., atropine 1 drop  q. 1 hour p.r.n. for secretions, glycopyrrolate 0.2 mg IV q. 4 p.r.n. for secretions, lorazepam 0.5 to 1 mg q. 1 p.r.n., morphine 2 to 4 mg IV q. 30 minutes p.r.n.    TIME SPENT: 35 minutes.  ____________________________ Shreyang H. Patel, MD shp:mr D: 01/12/2014 14:50:47 ET T: 01/12/2014 21:01:10 ET JOB#: [id number]  cc: Shreyang H. Patel, MD, <Dictator> SHREYANG H PATEL MD ELECTRONICALLY SIGNED 01/16/2014 11:31 

## 2015-02-16 NOTE — Consult Note (Signed)
Brief Consult Note: Diagnosis: ASx gallstones.   Patient was seen by consultant.   Comments: Pt has no abdominal pain, no nausea, no vomiting. Was noted to have GSs with a normal CBD diameter on admission. BNP > 16,000 ! w/ calcified coronary arteries, MV and AoV and worsened cardiomegaly. Likely his elevated LFTs are secondary to horrible pump function - ischemic cholestasis - and his gallstones are innocent bystanders. Risk for lap CCY is prohibitive in any case. No need for additional imaging. Will sign off.  Electronic Signatures: Claude MangesMarterre, Tomi Paddock F (MD)  (Signed 10-Mar-15 16:57)  Authored: Brief Consult Note   Last Updated: 10-Mar-15 16:57 by Claude MangesMarterre, Fatoumata Albaugh F (MD)

## 2015-02-16 NOTE — Consult Note (Signed)
   Comments   Follow up visit made. [facility]. Nephew in agreement. [facility] Home notified. Orders entered.  Dx: Cardiomyopathy EF < 20%   Secondary Dx: CHF, respiratory failure, transaminitis, thrombocytopenia  Electronic Signatures: Shadrack Brummitt, Harriett SineNancy (MD)  (Signed 20-Mar-15 17:40)  Authored: Palliative Care   Last Updated: 20-Mar-15 17:40 by Jordy Verba, Harriett SineNancy (MD)

## 2015-02-16 NOTE — Consult Note (Signed)
   Comments   Follow up visit made. Pt now sleeping. Arouses briefly to voice but goes back to sleep. Unable to give me his name. Pt got tussionex ~ 2hrs ago so hypersomnolence may be related to med. Also got tussionex yesterday AM so may also be a part of his lethagy/decompensation yesterday.  spoke with nephew. Will see if pt awakens to discuss [facility]ospice Home. Nephew however in agreement. Will follow up later to assess pt's clinical status.  Electronic Signatures: Jay Kempe, Harriett SineNancy (MD)  (Signed 26-Mar-15 12:06)  Authored: Palliative Care   Last Updated: 26-Mar-15 12:06 by Tyrah Broers, Harriett SineNancy (MD)

## 2015-02-16 NOTE — Consult Note (Signed)
   Present Illness the patient is a 81 year old male with history of chronic atrial fibrillation treated with rate control with Lopressor and Cardizem 240 mg daily. He is also apparently anticoagulated with warfarin. His INR is elevated. He presented to the emergency room her from his care home with apparent chest pain. Patient is a very difficult historian and is not entirely clear why he is at the hospital. He initially was in atrial fibrillation with rapid ventricular response. His rate has improved with IV Cardizem. He has a borderline serum troponin. He is currently hemodynamically stable.his chest x-ray revealed mild pulmonary edema with right lower lobe infiltrate. EKG revealed atrial fibrillation with rapid ventricular response with no significant ischemic changes. Chest and pelvic CT revealed nonobstructive renal calculi. Abdominal ultrasound revealed multiple gallstones with mild gallbladder thickening. He currently has no complaints. His atrial fibrillation is in good control.   Physical Exam:  GEN disheveled   HEENT hearing intact to voice   RESP normal resp effort  no use of accessory muscles  rhonchi   CARD Irregular rate and rhythm  Tachycardic  Murmur   Murmur Systolic   Systolic Murmur Out flow   ABD positive tenderness  normal BS   LYMPH negative neck, negative axillae   EXTR negative cyanosis/clubbing, negative edema   SKIN normal to palpation   NEURO cranial nerves intact, motor/sensory function intact   PSYCH poor insight   Review of Systems:  Subjective/Chief Complaint difficult historian   ROS Pt not able to provide ROS  poor historian   Medications/Allergies Reviewed Medications/Allergies reviewed   EKG:  Abnormal LBBB   Interpretation atrial fibrillation with variable ventricular response and left bundle branch block pattern    No Known Allergies:    Impression 81 year old male with apparent history of chronic atrial fibrillation treated with rate  control and chronic anticoagulati who was admitted for an clear Ariza and this. Patient is a difficult historian. He had had chest pain at his care center. On arrival to the emergency room, EKG was noted that he is in atrial fibrillation with rapid ventricular response. He had evidence of probable mild pulmonary edema as well as a possible mass in the right lower lobe of his lung. He also had elevated liver function studies and ultrasound of his gallbladder revealed multiple stones with gallbladder wall thickening. He appears to be hemodynamically stable from a cardiac standpoint. Would recommend discontinuing IV Cardizem and placing on Cardizem p.o. Would transfer to telemetry and continue to follow heart rate. Continue workup as abdominal and chest lesions.   Plan 1. Convert to Cardizem 60 mg q.6 2. Hold warfarin until decision regarding any potential surgery is completed 3. Echocardiogram to evaluate LV function 4. Further recommendations pending course   Electronic Signatures: Dalia HeadingFath, Kenneth A (MD)  (Signed 10-Mar-15 08:34)  Authored: General Aspect/Present Illness, History and Physical Exam, Review of System, EKG , Allergies, Impression/Plan   Last Updated: 10-Mar-15 08:34 by Dalia HeadingFath, Kenneth A (MD)

## 2015-02-16 NOTE — Discharge Summary (Signed)
PATIENT NAME:  Dan SolianHAIZLIP, Tejuan M MR#:  [id number] DATE OF BIRTH:  08/27/1929  DATE OF ADMISSION:  01/02/2014  DATE OF DISCHARGE:  01/19/2014  This is an addendum to earlier dictated discharge summary by Dr. Auburn BilberryShreyang Patel on 01/12/2014.   DISCHARGE DIAGNOSES: The patient was discharged to hospice, so final discharge diagnoses are  1.  Cardiomyopathy. 2.  Congestive heart failure. 3.  Aspiration pneumonitis. 4.  MRSA pneumonia.   MEDICATIONS ON DISCHARGE: 1.  Morphine, 0.5 mL oral 1 to 2 hours as needed.  2.  Lorazepam 0.5 mg oral tablet 1 to 2 tablets sublingual every 2 to 4 hours as needed.  3.  Zofran 4 mg oral tablet every 6 hours as needed for nausea and vomiting.  For earlier hospital admission and course, please see discharge summary done by Dr. Auburn BilberryShreyang Patel on 01/12/2014.   HOSPITAL COURSE AFTER MARCH 20:  1.  Initially there was a plan to send the patient to hospice home, but then the patient had improvement in his condition and he was more alert, so plan changed, and we were planning to send him to a rehab and trying to get speech and swallow evaluation.  The patient had improvement in overall condition, and finally had again episode of aspiration, had worsening in respiratory status. His sputum culture was growing MRSA, so antibiotic was changed to vanco and meropenem from Levaquin. Finally on March 25, he again had worsening in respiratory status, so his nephew, with the help of Dr. Harvie JuniorPhifer decided to make him comfort care again and send him to hospice home.   2.  Atrial fibrillation. Heart rate was controlled without any medication, and he remained stable in the hospital.   3.  Pneumonia. As mentioned above, with MRSA in sputum. We changed to meropenem and vancomycin, and finally stopped after he was converted to comfort care.   4.  Urinary retention. He was on Flomax.   CONSULT IN THE HOSPITAL: Dr. Harvie JuniorPhifer for palliative care.   IMPORTANT LAB RESULTS: After 01/12/2014, WBC  6.2, hemoglobin 13, and platelet count is 133. His chest x-ray PA and lateral on March 23 showed persistent moderate-size bilateral pleural effusion, enlargement of cardiac silhouette, mild engorgement of central pulmonary vasculature. Sputum culture showed heavy growth of MRSA, which was on March 24. Chest x-ray on March 26:  Increased bilateral  basilar airspace disease. Stable pleural effusion.   Total time spent on this discharge summary is 40 minutes.    ____________________________ Hope PigeonVaibhavkumar G. Elisabeth PigeonVachhani, MD vgv:mr D: 01/23/2014 14:51:36 ET T: 01/23/2014 19:30:28 ET JOB#: [id number]  cc: Hope PigeonVaibhavkumar G. Elisabeth PigeonVachhani, MD, <Dictator> Shreyang H. Allena KatzPatel, MD  Altamese DillingVAIBHAVKUMAR Jhoel Stieg MD ELECTRONICALLY SIGNED 01/25/2014 11:23

## 2015-02-16 NOTE — Consult Note (Signed)
PATIENT NAME:  Dan Lawson, Dan Lawson MR#:  [id number] DATE OF BIRTH:  [birth year]  DATE OF CONSULTATION:  01/08/2014  REFERRING PHYSICIAN:  Shreyang H. Allena KatzPatel, MD CONSULTING PHYSICIAN:  Dan MillardAlexander Eleonor Ocon, MD  REASON FOR CONSULTATION: Congestive heart failure, atrial fibrillation.   HISTORY OF PRESENT ILLNESS: The patient is an 81 year old gentleman with history of chronic atrial fibrillation who was admitted on 01/02/14 for respiratory failure, pneumonia, congestive heart failure and atrial fibrillation with a rapid ventricular rate. The patient apparently has had a history of chronic atrial fibrillation, on warfarin. On admission, chest x-ray revealed evidence for a right lower lobe infiltrate and pulmonary edema. The patient was admitted to CCU, treated with intravenous furosemide and antibiotics. The patient was transferred to telemetry only to return on 01/07/14 with recurrent respiratory failure and atrial fibrillation with a rapid ventricular rate. The patient was started on Levophed and amiodarone infusion. Heart rate is much improved. The patient appears clinically improved with decreased shortness of breath. The patient has remained hypotensive and has been difficult to wean off of Levophed. The patient has known severe cardiomyopathy with LVEF less than 20%.   PAST MEDICAL HISTORY: Chronic atrial fibrillation, chronic systolic congestive heart failure.    HOME MEDICATIONS:  Not listed.  SOCIAL HISTORY: The patient was living alone at home.   FAMILY HISTORY: No immediate family history of coronary artery disease or myocardial infarction.   REVIEW OF SYSTEMS:  CONSTITUTIONAL: No fever or chills.  EYES: No blurry vision.  EARS: No hearing loss.  RESPIRATORY: Shortness of breath.  CARDIOVASCULAR: The patient denies chest pain.  GASTROINTESTINAL: No nausea, vomiting or diarrhea.  GENITOURINARY: No dysuria or hematuria.  ENDOCRINE: No polyuria or polydipsia.  MUSCULOSKELETAL: No arthralgias  or myalgias.  NEUROLOGIC: No focal muscle weakness or numbness.  PSYCHOLOGICAL: No depression or anxiety.   PHYSICAL EXAMINATION: VITAL SIGNS: Blood pressure 84/58, pulse 64, respirations 10, temperature 96.3, pulse ox 96%.  HEENT: Pupils equal, reactive to light and accommodation.  NECK: Supple without thyromegaly.  LUNGS: Clear.  HEART: Normal JVP. Diffuse PMI, irregularly irregular rhythm. Normal S1, S2. No appreciable gallop, murmur or rub.  ABDOMEN: Soft and nontender.  EXTREMITIES: No cyanosis, clubbing or edema. Pulses were intact bilaterally.  MUSCULOSKELETAL: Normal muscle tone.  NEUROLOGIC: The patient is alert and oriented x 3. Motor and sensory are grossly intact. The patient had poor insight into his medical condition.   IMPRESSION: An 81 year old gentleman with severe cardiomyopathy, acute on chronic systolic congestive heart failure, pneumonia, with chronic atrial fibrillation with intermittent rapid ventricular rate, currently on Levophed and amiodarone drip.   RECOMMENDATIONS: 1.  Agree with overall current therapy.  2.  Continue to attempt wean off of Levophed drip.  3.  Continue amiodarone drip for now. Will likely convert to p.o. amiodarone as a means of controlling ventricular rate.  4.  Once off of the Levophed drip, attempt low-dose carvedilol 3.125 mg b.i.d.   ____________________________ Dan MillardAlexander Lue Sykora, MD ap:cs D: 01/08/2014 09:50:46 ET T: 01/08/2014 14:44:21 ET JOB#: [id number]  cc: Dan MillardAlexander Divinity Kyler, MD, <Dictator> Dan MillardALEXANDER Ferol Laiche MD ELECTRONICALLY SIGNED 01/16/2014 12:45
